# Patient Record
Sex: Female | Born: 1945 | Race: White | Hispanic: No | Marital: Single | Smoking: Never smoker
Health system: Southern US, Community
[De-identification: ages and names within clinical notes are randomized; demographics above are authoritative.]

## PROBLEM LIST (undated history)

## (undated) DIAGNOSIS — E119 Type 2 diabetes mellitus without complications: Secondary | ICD-10-CM

## (undated) DIAGNOSIS — G629 Polyneuropathy, unspecified: Secondary | ICD-10-CM

## (undated) HISTORY — PX: REPLACEMENT TOTAL KNEE: SUR1224

## (undated) HISTORY — PX: SHOULDER SURGERY: SHX246

## (undated) HISTORY — PX: CARDIAC CATHETERIZATION: SHX172

---

## 2016-03-26 ENCOUNTER — Encounter: Payer: Self-pay | Admitting: Emergency Medicine

## 2016-03-26 ENCOUNTER — Emergency Department: Payer: Medicare Other

## 2016-03-26 ENCOUNTER — Emergency Department
Admission: EM | Admit: 2016-03-26 | Discharge: 2016-03-26 | Disposition: A | Discharge status: Home / Self Care | Payer: Medicare Other | Attending: Emergency Medicine | Admitting: Emergency Medicine

## 2016-03-26 DIAGNOSIS — Z5181 Encounter for therapeutic drug level monitoring: Secondary | ICD-10-CM | POA: Diagnosis not present

## 2016-03-26 DIAGNOSIS — Y92009 Unspecified place in unspecified non-institutional (private) residence as the place of occurrence of the external cause: Secondary | ICD-10-CM | POA: Insufficient documentation

## 2016-03-26 DIAGNOSIS — S0990XA Unspecified injury of head, initial encounter: Secondary | ICD-10-CM | POA: Insufficient documentation

## 2016-03-26 DIAGNOSIS — E119 Type 2 diabetes mellitus without complications: Secondary | ICD-10-CM | POA: Insufficient documentation

## 2016-03-26 DIAGNOSIS — Z96653 Presence of artificial knee joint, bilateral: Secondary | ICD-10-CM | POA: Insufficient documentation

## 2016-03-26 DIAGNOSIS — Y939 Activity, unspecified: Secondary | ICD-10-CM | POA: Insufficient documentation

## 2016-03-26 DIAGNOSIS — W01198A Fall on same level from slipping, tripping and stumbling with subsequent striking against other object, initial encounter: Secondary | ICD-10-CM | POA: Diagnosis not present

## 2016-03-26 DIAGNOSIS — Y999 Unspecified external cause status: Secondary | ICD-10-CM | POA: Insufficient documentation

## 2016-03-26 HISTORY — DX: Type 2 diabetes mellitus without complications: E11.9

## 2016-03-26 HISTORY — DX: Polyneuropathy, unspecified: G62.9

## 2016-03-26 LAB — COMPREHENSIVE METABOLIC PANEL
ALBUMIN: 3.7 g/dL (ref 3.5–5.0)
ALT: 23 U/L (ref 14–54)
ANION GAP: 10 (ref 5–15)
AST: 34 U/L (ref 15–41)
Alkaline Phosphatase: 89 U/L (ref 38–126)
BILIRUBIN TOTAL: 0.6 mg/dL (ref 0.3–1.2)
BUN: 43 mg/dL — ABNORMAL HIGH (ref 6–20)
CO2: 24 mmol/L (ref 22–32)
Calcium: 8.9 mg/dL (ref 8.9–10.3)
Chloride: 105 mmol/L (ref 101–111)
Creatinine, Ser: 1.51 mg/dL — ABNORMAL HIGH (ref 0.44–1.00)
GFR calc Af Amer: 39 mL/min — ABNORMAL LOW (ref >60)
GFR calc non Af Amer: 34 mL/min — ABNORMAL LOW (ref >60)
GLUCOSE: 296 mg/dL — AB (ref 65–99)
POTASSIUM: 3.9 mmol/L (ref 3.5–5.1)
SODIUM: 139 mmol/L (ref 135–145)
TOTAL PROTEIN: 6.7 g/dL (ref 6.5–8.1)

## 2016-03-26 LAB — CBC WITH DIFFERENTIAL/PLATELET
BASOS PCT: 1 %
Basophils Absolute: 0.1 10*3/uL (ref 0–0.1)
EOS ABS: 0.4 10*3/uL (ref 0–0.7)
Eosinophils Relative: 4 %
HEMATOCRIT: 35.6 % (ref 35.0–47.0)
Hemoglobin: 11.8 g/dL — ABNORMAL LOW (ref 12.0–16.0)
Lymphocytes Relative: 13 %
Lymphs Abs: 1.3 10*3/uL (ref 1.0–3.6)
MCH: 29.2 pg (ref 26.0–34.0)
MCHC: 33.2 g/dL (ref 32.0–36.0)
MCV: 88 fL (ref 80.0–100.0)
MONO ABS: 0.6 10*3/uL (ref 0.2–0.9)
Monocytes Relative: 6 %
NEUTROS ABS: 7.3 10*3/uL — AB (ref 1.4–6.5)
Neutrophils Relative %: 76 %
Platelets: 186 10*3/uL (ref 150–440)
RBC: 4.05 MIL/uL (ref 3.80–5.20)
RDW: 16.5 % — AB (ref 11.5–14.5)
WBC: 9.7 10*3/uL (ref 3.6–11.0)

## 2016-03-26 LAB — PROTIME-INR
INR: 1.15
PROTHROMBIN TIME: 14.8 s (ref 11.4–15.2)

## 2016-03-26 MED ORDER — FENTANYL CITRATE (PF) 100 MCG/2ML IJ SOLN
50.0000 ug | Freq: Once | INTRAMUSCULAR | Status: AC
  Administered 2016-03-26: 50 ug via INTRAVENOUS
  Filled 2016-03-26: qty 2

## 2016-03-26 MED ORDER — TRAMADOL HCL 50 MG PO TABS: 50.0000 mg | ORAL_TABLET | Freq: Four times a day (QID) | ORAL | 0 refills | Status: AC | PRN

## 2016-03-26 MED ORDER — ACETAMINOPHEN 325 MG PO TABS: 650.0000 mg | ORAL_TABLET | Freq: Once | ORAL | Status: DC

## 2016-03-26 MED ORDER — OXYCODONE-ACETAMINOPHEN 5-325 MG PO TABS
1.0000 | ORAL_TABLET | Freq: Once | ORAL | Status: AC
  Administered 2016-03-26: 1 via ORAL
  Filled 2016-03-26: qty 1
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
## 2016-03-26 NOTE — ED Notes (Signed)
Patient states she is on Eliquis for blood thinning purposes.  MD notified.

## 2016-03-26 NOTE — ED Provider Notes (Addendum)
[facility] Emergency Department Provider Note  ____________________________________________   I have reviewed the triage vital signs and the nursing notes.   HISTORY  Chief Complaint Head Injury    HPI Marie Patel is a 70 y.o. female who is on Eliquis, presents today after a non-syncopal fall. She tripped and hit her head. She did not pass out. This is a remembered incident. No vomiting. Complains of pain to the back of her head. She does not have any other injury. Denies hip pain or other complaint. Felt fine before the accident.     Past Medical History:  Diagnosis Date  . Diabetes mellitus without complication (HCC)   . Neuropathy (HCC)     There are no active problems to display for this patient.   Past Surgical History:  Procedure Laterality Date  . CARDIAC CATHETERIZATION     x2  . REPLACEMENT TOTAL KNEE     bilateral  . SHOULDER SURGERY      Prior to Admission medications   Not on File    Allergies Patient has no known allergies.  No family history on file.  Social History Social History  Substance Use Topics  . Smoking status: Never Smoker  . Smokeless tobacco: Never Used  . Alcohol use No    Review of Systems Constitutional: No fever/chills Eyes: No visual changes. ENT: No sore throat. No stiff neck no neck pain Cardiovascular: Denies chest pain. Respiratory: Denies shortness of breath. Gastrointestinal:   no vomiting.  No diarrhea.  No constipation. Genitourinary: Negative for dysuria. Musculoskeletal: Negative lower extremity swelling Skin: Negative for rash. Neurological: Negative for severe headaches, focal weakness or numbness. 10-point ROS otherwise negative.  ____________________________________________   PHYSICAL EXAM:  VITAL SIGNS: ED Triage Vitals [03/26/16 1641]  Enc Vitals Group     BP      Pulse      Resp      Temp      Temp src      SpO2      Weight      Height      Head  Circumference      Peak Flow      Pain Score 10     Pain Loc      Pain Edu?      Excl. in GC?     Constitutional: Alert and oriented. Well appearing and in no acute distress. Eyes: Conjunctivae are normal. PERRL. EOMI. Head: Tenderness palpation the occiput with no evidence of skull fracture.. Nose: No congestion/rhinnorhea. Mouth/Throat: Mucous membranes are moist.  Oropharynx non-erythematous. Neck: No stridor.   Midline tenderness noted. Cardiovascular: Normal rate, regular rhythm. Grossly normal heart sounds.  Good peripheral circulation. Respiratory: Normal respiratory effort.  No retractions. Lungs CTAB. Abdominal: Soft and nontender. No distention. No guarding no rebound Back:  There is no focal tenderness or step off.  there is no midline tenderness there are no lesions noted. there is no CVA tenderness Musculoskeletal: No lower extremity tenderness, no upper extremity tenderness. No joint effusions, no DVT signs strong distal pulses no edema Neurologic:  Normal speech and language. No gross focal neurologic deficits are appreciated.  Skin:  Skin is warm, dry and intact. No rash noted. Psychiatric: Mood and affect are normal. Speech and behavior are normal.  ____________________________________________   LABS (all labs ordered are listed, but only abnormal results are displayed)  Labs Reviewed  CBC WITH DIFFERENTIAL/PLATELET  COMPREHENSIVE METABOLIC PANEL  PROTIME-INR   ____________________________________________  EKG  I personally interpreted any EKGs ordered by me or triage Atrial fibrillation rate 104 no acute ST elevation or depression normal axis ____________________________________________  RADIOLOGY  I reviewed any imaging ordered by me or triage that were performed during my shift and, if possible, patient and/or family made aware of any abnormal findings. ____________________________________________   PROCEDURES  Procedure(s) performed:  None  Procedures  Critical Care performed: None  ____________________________________________   INITIAL IMPRESSION / ASSESSMENT AND PLAN / ED COURSE  Pertinent labs & imaging results that were available during my care of the patient were reviewed by me and considered in my medical decision making (see chart for details).  A here with non-syncopal fall. Primarily, concerned about closed head injury. Given her blood thinner, this is of course very concerning. We did a stat CT of the head and neck for this reason. Results are pending but by my read there is no obvious large bleed. Patient remains her lodgment touch. We'll give her pain medication for the bruising to the back of her head, and we will watch her closely here.  ----------------------------------------- 6:49 PM on 03/26/2016 -----------------------------------------  Patient in no acute distress ambulatory neurologically intact no headache requesting discharge Nearly 2 hour obs here shows no evidence of decompensation and family eager to go home. Return precautions and f/u given and understood.  Clinical Course    ____________________________________________   FINAL CLINICAL IMPRESSION(S) / ED DIAGNOSES  Final diagnoses:  None      This chart was dictated using voice recognition software.  Despite best efforts to proofread,  errors can occur which can change meaning.      Jeanmarie PlantJames A Dalayza Zambrana, MD 03/26/16 1711    Jeanmarie PlantJames A Fredna Stricker, MD 03/26/16 [phone]

## 2016-03-26 NOTE — ED Notes (Signed)
Patient transported to CT 

## 2017-12-10 IMAGING — CT CT CERVICAL SPINE W/O CM
4 of 7 series · 15 of 33 positions shown, 16 images · non-contrast
Comparison: None.

CLINICAL DATA: 70-year-old female with fall and acute head and neck
injury today. Initial encounter.

EXAM:
CT HEAD WITHOUT CONTRAST
CT CERVICAL SPINE WITHOUT CONTRAST
TECHNIQUE: Multidetector CT imaging of the head and cervical spine was
performed following the standard protocol without intravenous
contrast. Multiplanar CT image reconstructions of the cervical spine
were also generated.

[Series 4: coronal soft tissue · coronal · 0.29mm/px · 3 of 65 slices shown]
[im 17/65  bone]
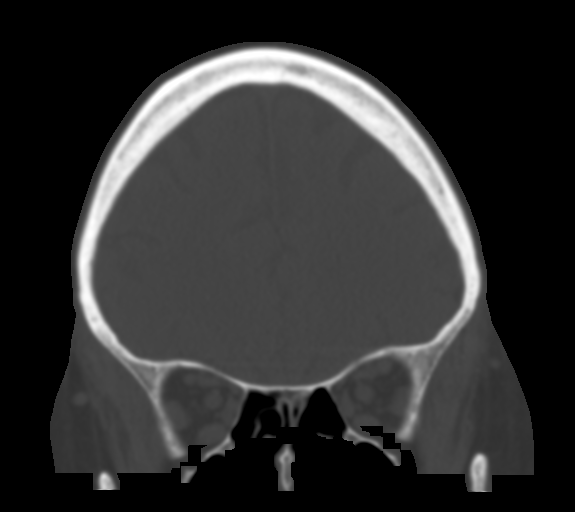
[im 33/65  bone]
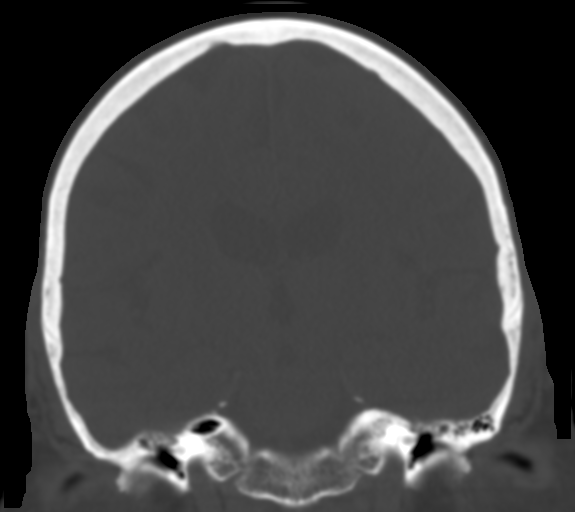
[im 49/65  bone]
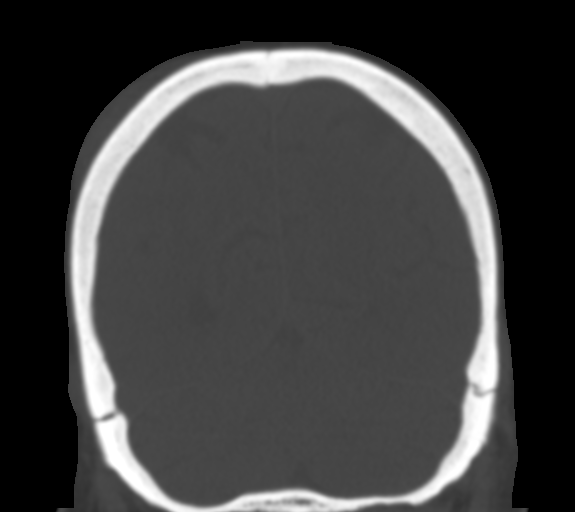

[Series 7: c spine soft · axial · 0.36mm/px · z∈[-200,-130]mm · 3 of 89 slices shown]
[im 18/89  soft-tissue]
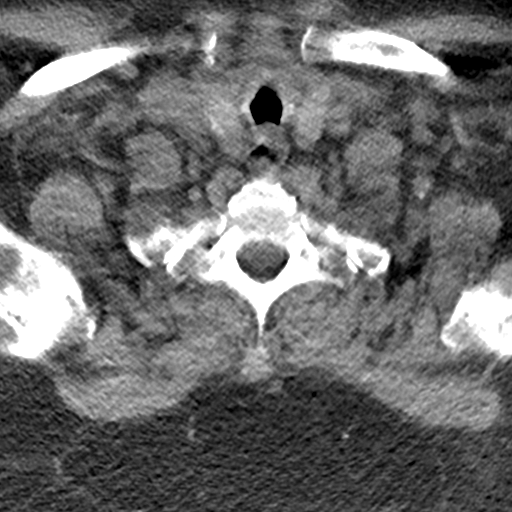
[im 36/89  soft-tissue]
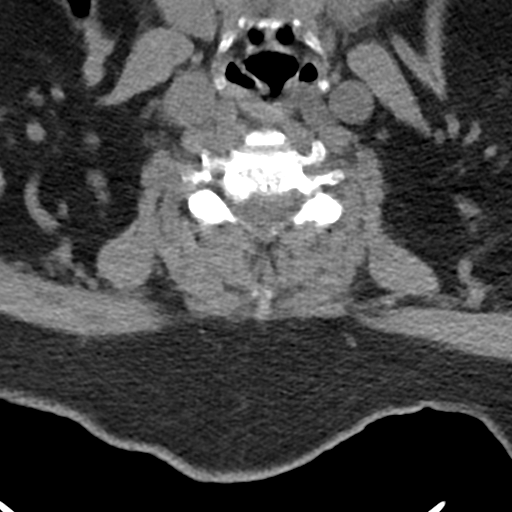
[im 53/89  soft-tissue]
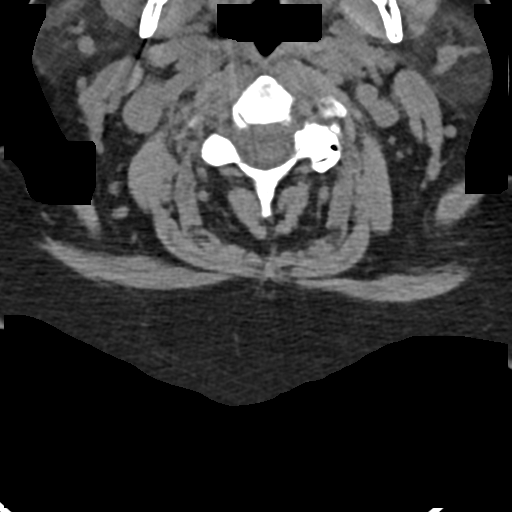

[Series 8: sagittal bone · sagittal · 0.26mm/px · 5 of 58 slices shown]
[im 10/58  bone]
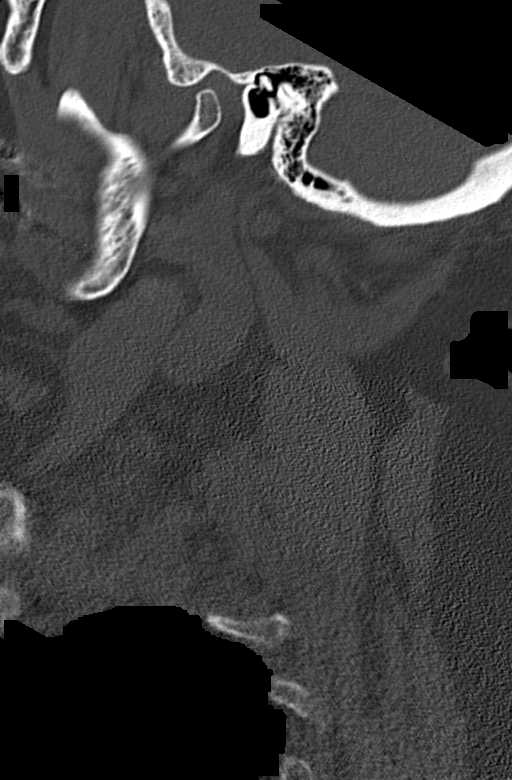
[im 20/58  bone]
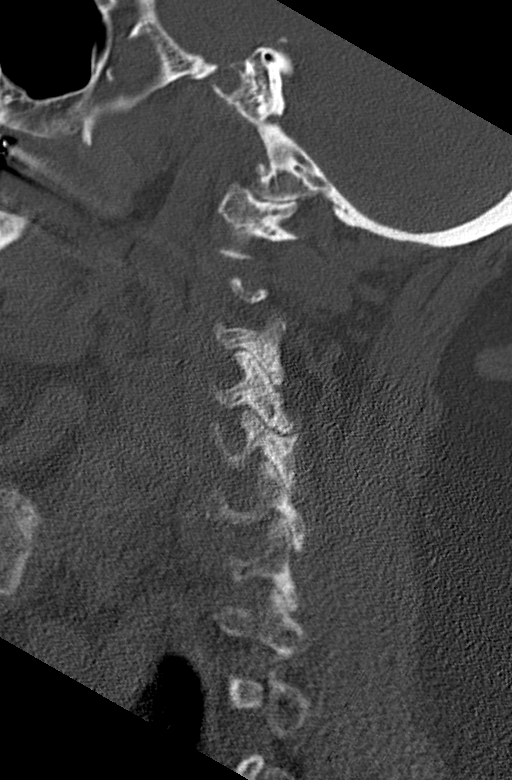
[im 29/58  bone]
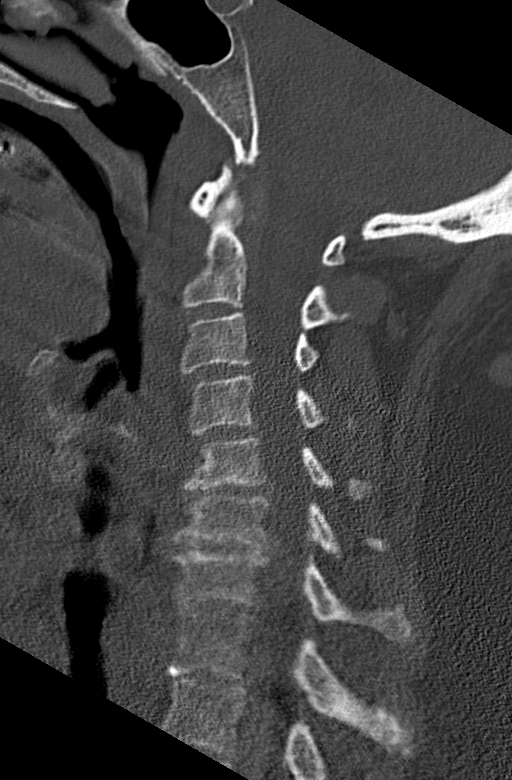
[im 39/58  bone]
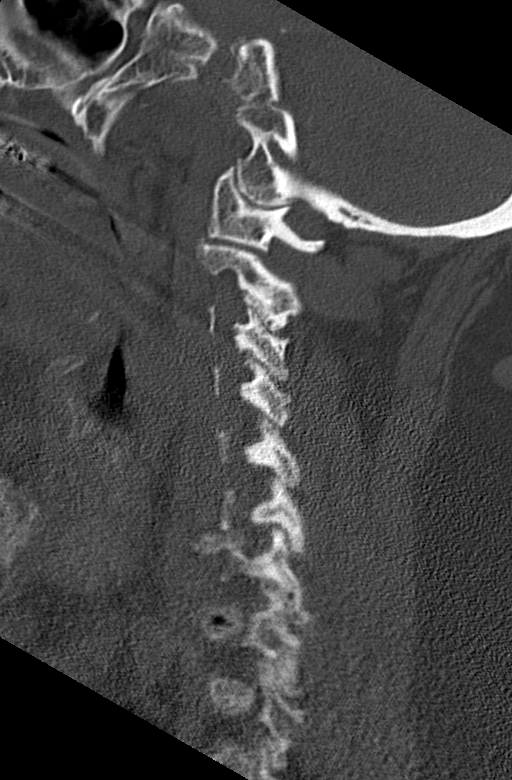
[im 48/58  bone]
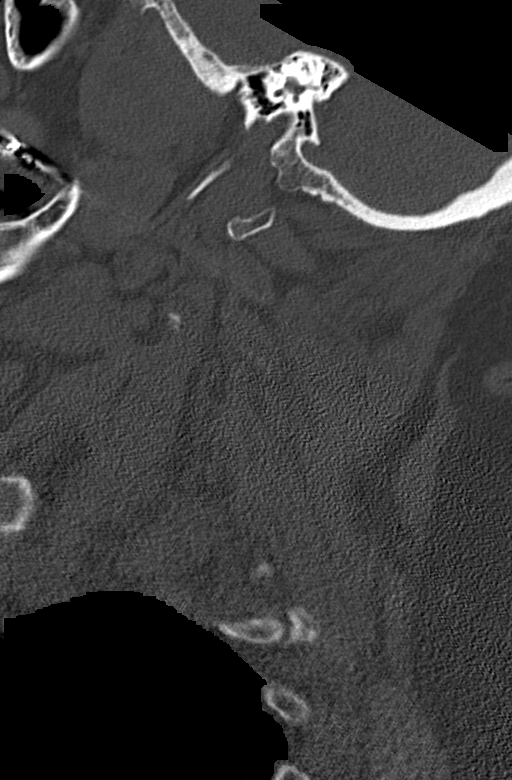

[Series 10: orthogonal bone · axial · 0.25mm/px · z∈[-229,-131]mm · 4 of 94 slices shown, 5 images]
[im 19/94  soft-tissue]
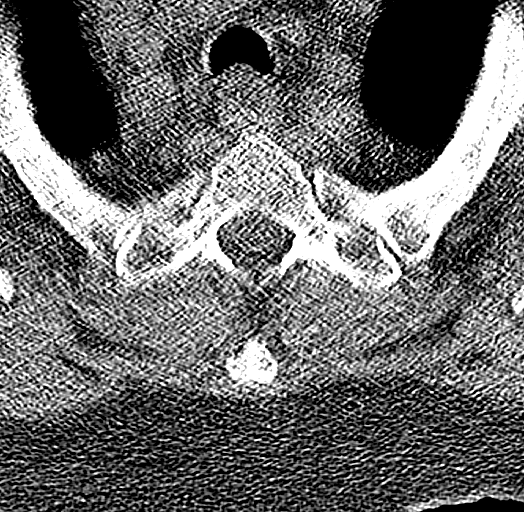
[im 19/94  bone]
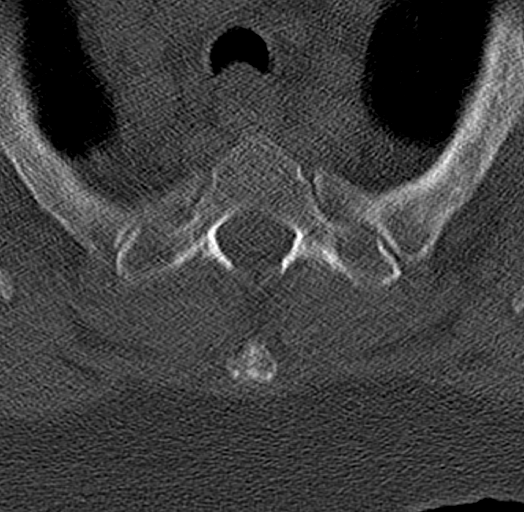
[im 38/94  bone]
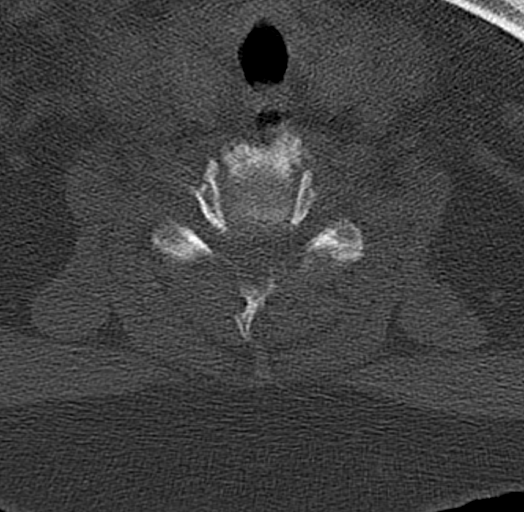
[im 56/94  bone]
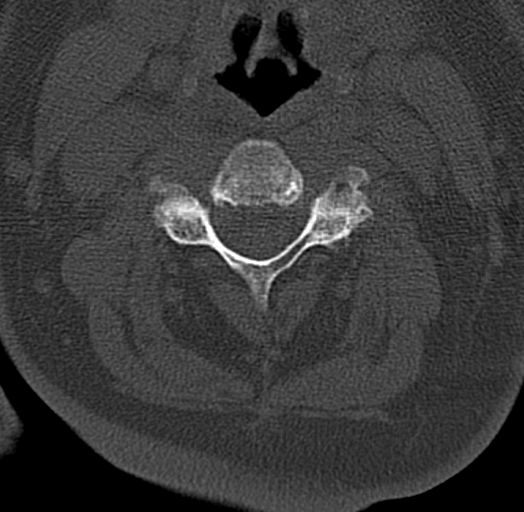
[im 75/94  bone]
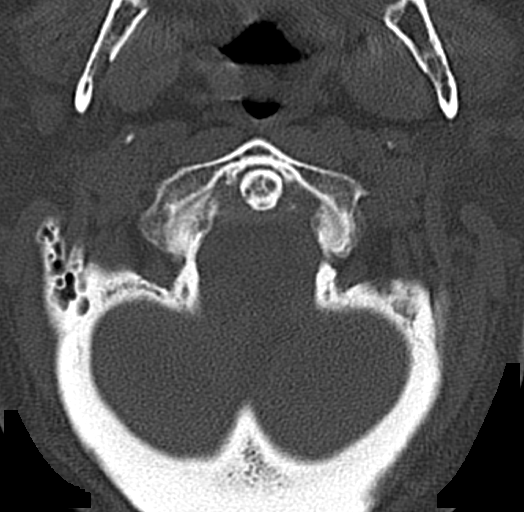

[15 of 33 positions shown; findings below may reference images not displayed]

FINDINGS: CT HEAD FINDINGS

Brain: No evidence of acute infarction, hemorrhage, hydrocephalus,
extra-axial collection or mass lesion/mass effect.

Mild probable chronic small-vessel white matter ischemic changes are
noted.

Vascular: Mild intracranial atherosclerotic calcifications are
noted.

Skull: Normal. Negative for fracture or focal lesion.

Sinuses/Orbits: No acute finding.

Other: Posterior right scalp soft tissue swelling is noted.

CT CERVICAL SPINE FINDINGS

Alignment: Mild straightening of the normal cervical lordosis noted.
No evidence of subluxation identified.

Skull base and vertebrae: No acute fracture. No primary bone lesion
or focal pathologic process.

Soft tissues and spinal canal: No prevertebral fluid or swelling. No
visible canal hematoma.

Disc levels: Mild to moderate degenerative disc disease and
spondylosis at C5-6 and C6-7 noted. Multilevel facet arthropathy is
present. These changes contribute to moderate- severe central spinal
narrowing at C6-7, and mild to moderate central spinal and foraminal
narrowing at other levels.

Upper chest: Negative.

Other: None
IMPRESSION: No evidence of acute intracranial abnormality.

Posterior right scalp soft tissue swelling without fracture.

No static evidence of acute injury to the cervical spine.
Degenerative changes contributing to central spinal and foraminal
narrowing as described.

## 2018-01-25 ENCOUNTER — Other Ambulatory Visit: Payer: Self-pay | Admitting: *Deleted

## 2019-04-04 DEATH — deceased

## 2019-05-05 DEATH — deceased
